# Patient Record
Sex: Male | Born: 1975 | Race: White | Hispanic: No | Marital: Married | Smoking: Never smoker
Health system: Southern US, Community
[De-identification: ages and names within clinical notes are randomized; demographics above are authoritative.]

## PROBLEM LIST (undated history)

## (undated) DIAGNOSIS — R519 Headache, unspecified: Secondary | ICD-10-CM

## (undated) DIAGNOSIS — M1990 Unspecified osteoarthritis, unspecified site: Secondary | ICD-10-CM

## (undated) DIAGNOSIS — R51 Headache: Secondary | ICD-10-CM

## (undated) DIAGNOSIS — N200 Calculus of kidney: Secondary | ICD-10-CM

## (undated) DIAGNOSIS — G43909 Migraine, unspecified, not intractable, without status migrainosus: Secondary | ICD-10-CM

## (undated) HISTORY — DX: Headache, unspecified: R51.9

## (undated) HISTORY — DX: Unspecified osteoarthritis, unspecified site: M19.90

## (undated) HISTORY — DX: Migraine, unspecified, not intractable, without status migrainosus: G43.909

## (undated) HISTORY — DX: Calculus of kidney: N20.0

## (undated) HISTORY — PX: OTHER SURGICAL HISTORY: SHX169

## (undated) HISTORY — DX: Headache: R51

---

## 2007-08-12 ENCOUNTER — Emergency Department (HOSPITAL_COMMUNITY): Admission: EM | Admit: 2007-08-12 | Discharge: 2007-08-12 | Payer: Self-pay | Admitting: Emergency Medicine

## 2007-08-12 ENCOUNTER — Emergency Department (HOSPITAL_COMMUNITY): Admission: EM | Admit: 2007-08-12 | Discharge: 2007-08-12 | Payer: Self-pay | Admitting: Family Medicine

## 2008-05-08 ENCOUNTER — Emergency Department (HOSPITAL_COMMUNITY): Admission: EM | Admit: 2008-05-08 | Discharge: 2008-05-09 | Payer: Self-pay | Admitting: Emergency Medicine

## 2008-06-14 ENCOUNTER — Emergency Department (HOSPITAL_COMMUNITY): Admission: EM | Admit: 2008-06-14 | Discharge: 2008-06-14 | Payer: Self-pay | Admitting: Emergency Medicine

## 2010-05-27 IMAGING — CR DG CHEST 2V
2 series · 2 of 2 positions shown · non-contrast
Comparison: None.

CLINICAL DATA: Right chest pain.

CHEST - 2 VIEW

[w chest pa]
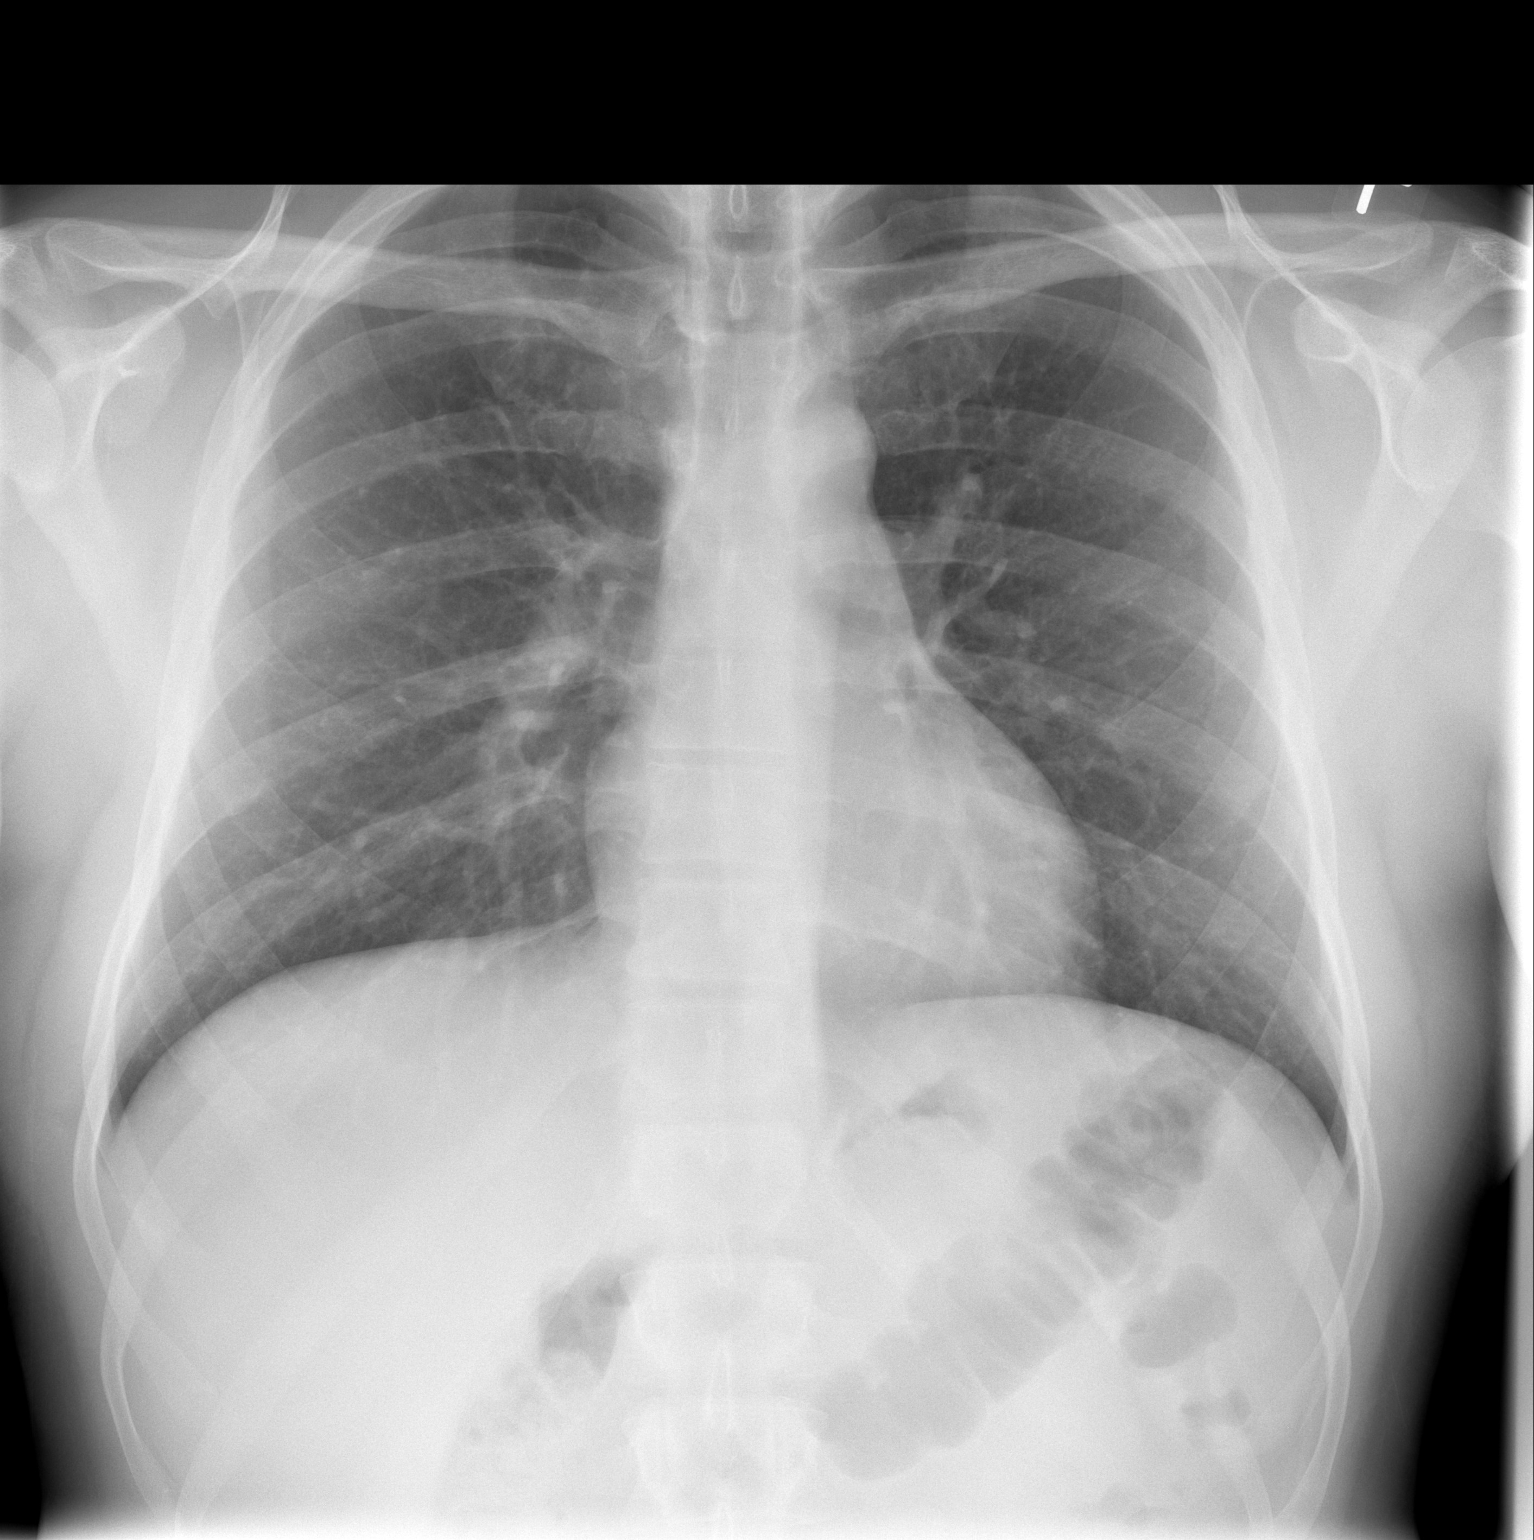

[w chest lat]
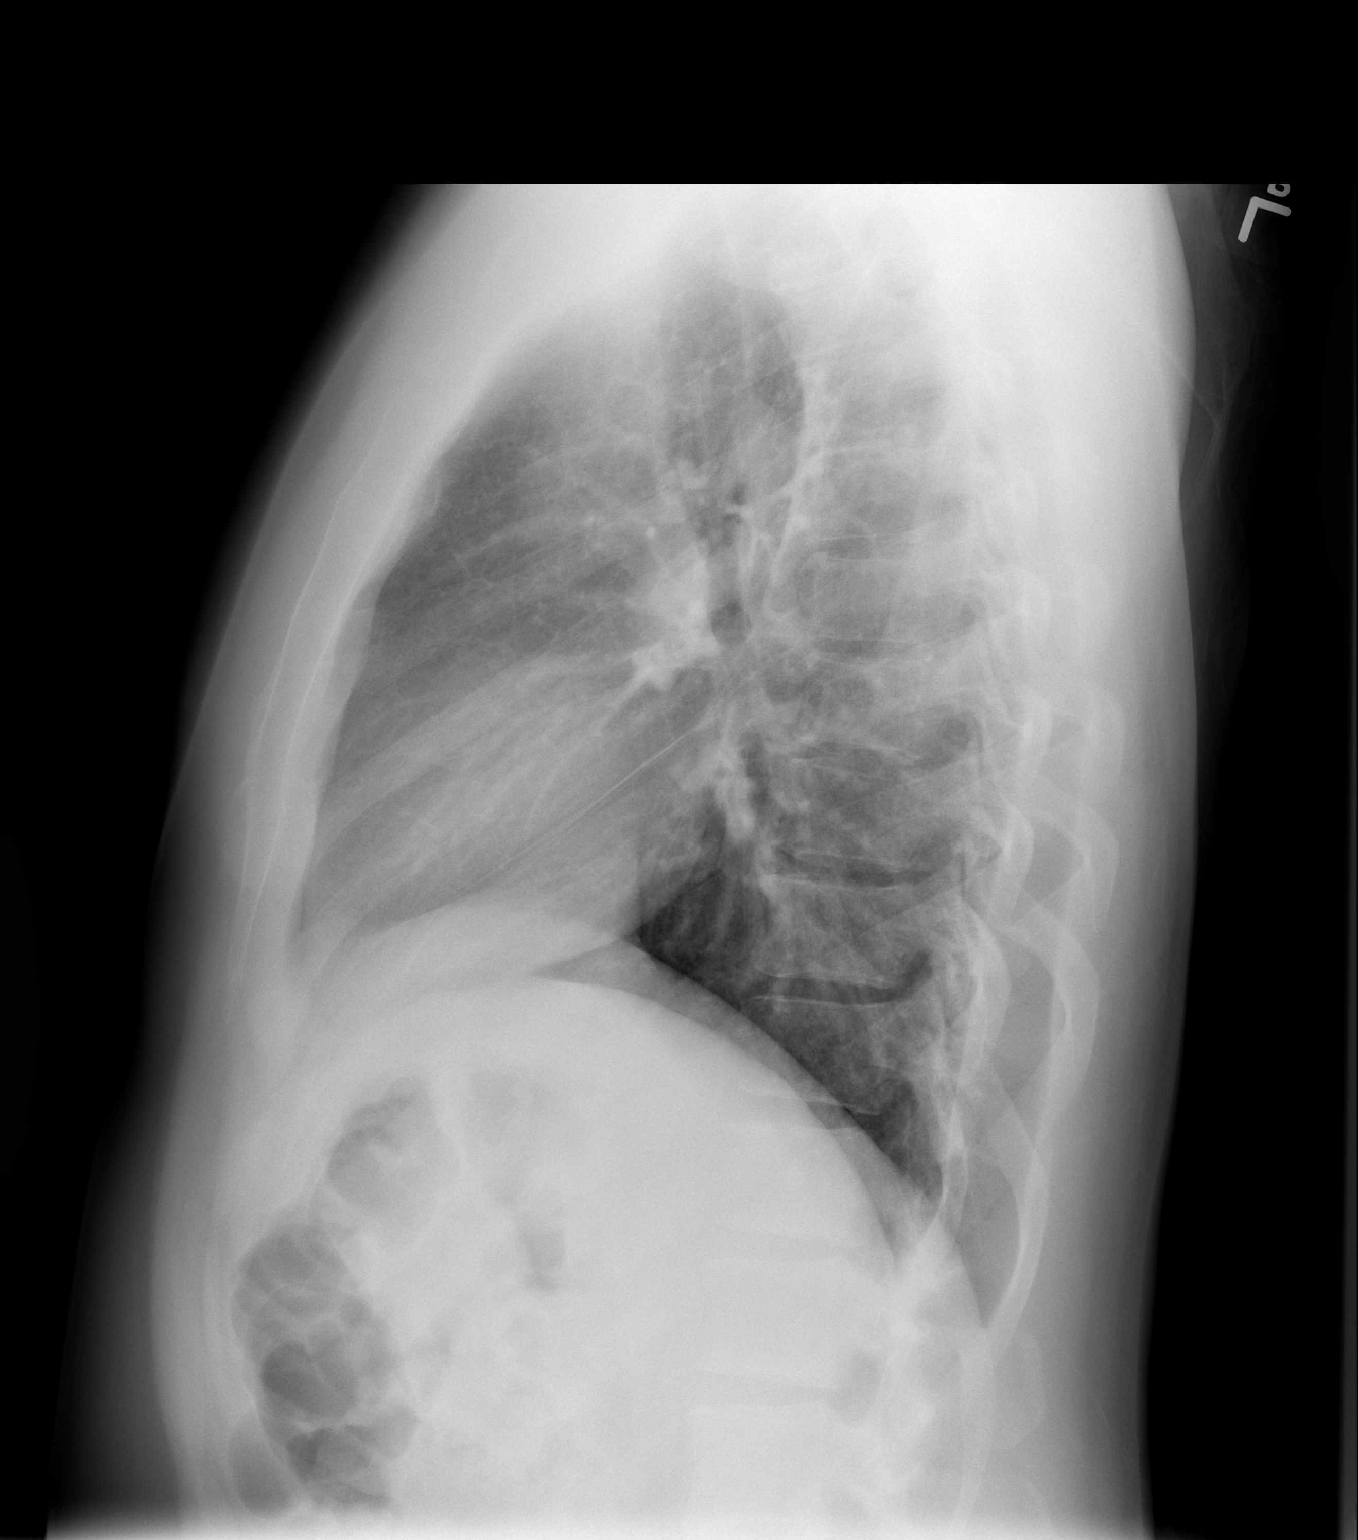

[2 of 2 positions shown; findings below may reference images not displayed]

FINDINGS: Normal sized heart.  Clear lungs with normal vascularity.
Minimal scoliosis.
IMPRESSION: No acute abnormality.

## 2010-06-21 LAB — DIFFERENTIAL
Basophils Absolute: 0 10*3/uL (ref 0.0–0.1)
Eosinophils Absolute: 0.2 10*3/uL (ref 0.0–0.7)
Eosinophils Relative: 2 % (ref 0–5)
Monocytes Absolute: 0.9 10*3/uL (ref 0.1–1.0)

## 2010-06-21 LAB — URINALYSIS, ROUTINE W REFLEX MICROSCOPIC
Hgb urine dipstick: NEGATIVE
Protein, ur: NEGATIVE mg/dL
Specific Gravity, Urine: 1.029 (ref 1.005–1.030)
Urobilinogen, UA: 1 mg/dL (ref 0.0–1.0)

## 2010-06-21 LAB — COMPREHENSIVE METABOLIC PANEL
ALT: 28 U/L (ref 0–53)
AST: 17 U/L (ref 0–37)
Albumin: 4.2 g/dL (ref 3.5–5.2)
Alkaline Phosphatase: 70 U/L (ref 39–117)
CO2: 29 mEq/L (ref 19–32)
Chloride: 101 mEq/L (ref 96–112)
GFR calc Af Amer: >60 mL/min (ref >60)
GFR calc non Af Amer: >60 mL/min (ref >60)
Potassium: 4 mEq/L (ref 3.5–5.1)
Sodium: 137 mEq/L (ref 135–145)
Total Bilirubin: 0.4 mg/dL (ref 0.3–1.2)

## 2010-06-21 LAB — CBC
MCV: 87.1 fL (ref 78.0–100.0)
Platelets: 243 10*3/uL (ref 150–400)
RBC: 5.08 MIL/uL (ref 4.22–5.81)
WBC: 10.9 10*3/uL — ABNORMAL HIGH (ref 4.0–10.5)

## 2015-12-14 ENCOUNTER — Emergency Department (HOSPITAL_COMMUNITY)
Admission: EM | Admit: 2015-12-14 | Discharge: 2015-12-14 | Disposition: A | Discharge status: Home / Self Care | Payer: Self-pay

## 2015-12-14 ENCOUNTER — Other Ambulatory Visit: Payer: Self-pay | Admitting: Nurse Practitioner

## 2015-12-14 ENCOUNTER — Ambulatory Visit
Admission: RE | Admit: 2015-12-14 | Discharge: 2015-12-14 | Disposition: A | Discharge status: Home / Self Care | Payer: Worker's Compensation | Source: Ambulatory Visit | Attending: Nurse Practitioner | Admitting: Nurse Practitioner

## 2015-12-14 DIAGNOSIS — T1490XA Injury, unspecified, initial encounter: Secondary | ICD-10-CM

## 2015-12-14 NOTE — ED Notes (Signed)
Per this pt administrator pt needs to go to a different hospital to be seen for workmans comp

## 2016-06-19 ENCOUNTER — Ambulatory Visit (INDEPENDENT_AMBULATORY_CARE_PROVIDER_SITE_OTHER): Payer: 59 | Admitting: Adult Health

## 2016-06-19 ENCOUNTER — Encounter: Payer: Self-pay | Admitting: Adult Health

## 2016-06-19 DIAGNOSIS — G43001 Migraine without aura, not intractable, with status migrainosus: Secondary | ICD-10-CM | POA: Diagnosis not present

## 2016-06-19 DIAGNOSIS — Z23 Encounter for immunization: Secondary | ICD-10-CM

## 2016-06-19 DIAGNOSIS — L918 Other hypertrophic disorders of the skin: Secondary | ICD-10-CM

## 2016-06-19 DIAGNOSIS — G5603 Carpal tunnel syndrome, bilateral upper limbs: Secondary | ICD-10-CM

## 2016-06-19 DIAGNOSIS — Z7689 Persons encountering health services in other specified circumstances: Secondary | ICD-10-CM

## 2016-06-19 MED ORDER — PREDNISONE 20 MG PO TABS
20.0000 mg | ORAL_TABLET | Freq: Every day | ORAL | 0 refills | Status: DC
Start: 2016-06-19 — End: 2020-05-31

## 2016-06-19 NOTE — Patient Instructions (Signed)
I have sent in a prescription for Prednisone to help with your carpal tunnel. Please take one pill per day for two weeks   Follow up with me for your physical or sooner if needed

## 2016-06-19 NOTE — Progress Notes (Signed)
Patient presents to clinic today to establish care. He is a pleasant 41 year old male who  has a past medical history of Arthritis; Frequent headaches; Kidney stones; and Migraines.   Acute Concerns: Establish Care  Carpal Tunnel Syndrome - this is been an ongoing issue for multiple months. He reports numbness and tingling in bilateral hands more notably in the first 3 digits of both hands. 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  Skin Tag Removal - he has 2 skin tags on his right lower buttocks that he reports are painful especially when sitting for multiple hours in a car.  Chronic Issues: Frequent headaches/migraines - once a week. This has been an ongoing issue. He takes Tylenol/ Goodies and reports that he gets relief from this medication.   Health Maintenance: Dental -- Routine Care Vision -- Does not do  Routine care Immunizations -- Needs Tdap  Colonoscopy -- Never had  Diet : Tries to eat healthy  Exercise: Does not exercise outside     Past Medical History:  Diagnosis Date  . Arthritis   . Frequent headaches   . Kidney stones   . Migraines     No past surgical history on file.  No current outpatient prescriptions on file prior to visit.   No current facility-administered medications on file prior to visit.     Not on File  No family history on file.  Social History   Social History  . Marital status: Married    Spouse name: N/A  . Number of children: N/A  . Years of education: N/A   Occupational History  . Not on file.   Social History Main Topics  . Smoking status: Not on file  . Smokeless tobacco: Not on file  . Alcohol use Not on file  . Drug use: Unknown  . Sexual activity: Not on file   Other Topics Concern  . Not on file   Social History Narrative  . No narrative on file    Review of Systems  Constitutional: Negative.   HENT: Negative.   Eyes: Negative.   Respiratory: Negative.   Cardiovascular:  Negative.   Gastrointestinal: Negative.   Genitourinary: Negative.   Musculoskeletal: Negative.   Skin: Negative.   Neurological: Positive for tingling, sensory change and headaches. Negative for focal weakness.  Endo/Heme/Allergies: Negative.   Psychiatric/Behavioral: Negative.   All other systems reviewed and are negative.   There were no vitals taken for this visit.  Physical Exam  Constitutional: He is oriented to person, place, and time and well-developed, well-nourished, and in no distress. No distress.  HENT:  Head: Normocephalic and atraumatic.  Right Ear: External ear normal.  Left Ear: External ear normal.  Nose: Nose normal.  Mouth/Throat: Oropharynx is clear and moist. No oropharyngeal exudate.  Eyes: Conjunctivae and EOM are normal. Pupils are equal, round, and reactive to light. Right eye exhibits no discharge. Left eye exhibits no discharge. No scleral icterus.  Neck: Trachea normal and normal range of motion. Neck supple. Carotid bruit is not present. No thyroid mass and no thyromegaly present.  Cardiovascular: Normal rate, regular rhythm, normal heart sounds and intact distal pulses.  Exam reveals no gallop and no friction rub.   No murmur heard. Pulmonary/Chest: Effort normal and breath sounds normal. No respiratory distress. He has no wheezes. He has no rales. He exhibits no tenderness.  Musculoskeletal: Normal range of motion. He exhibits no edema, tenderness or deformity.  Lymphadenopathy:  He has no cervical adenopathy.  Neurological: He is alert and oriented to person, place, and time. Gait normal. GCS score is 15.  Skin: Skin is warm and dry. No rash noted. He is not diaphoretic. No erythema. No pallor.  2 noninflamed skin tags located on right lower buttocks  Psychiatric: Memory, affect and judgment normal.  Nursing note and vitals reviewed.   Assessment/Plan: 1. Encounter to establish care - Advised to follow-up for complete physical exam -  Encouraged a heart healthy diet and frequent exercise  2. Bilateral carpal tunnel syndrome  - predniSONE (DELTASONE) 20 MG tablet; Take 1 tablet (20 mg total) by mouth daily with breakfast.  Dispense: 14 tablet; Refill: 0 - Advised to use wrist splints during the day when he is sleeping - Consider referral to sports medicine for cortisone injection 3. Need for Tdap vaccination  - Tdap vaccine greater than or equal to 7yo IM  4. Skin tag  Skin tags are snipped off using Betadine for cleansing and sterile iris scissors. Local anesthesia was not used. These pathognomonic lesions are not sent for pathology.   5. Migraine without aura and with status migrainosus, not intractable - We spoke about migraine triggers. He is going to work on staying better hydrated. He does not want try prophylactic medication at this time. - Consider Elavil - Sitter MRI in the future if migraines do not dissipate   Dorothyann Peng, NP

## 2018-04-11 ENCOUNTER — Ambulatory Visit: Payer: Self-pay | Admitting: Nurse Practitioner

## 2018-04-11 VITALS — BP 114/70 | HR 102 | Temp 103.0°F | Wt 217.8 lb

## 2018-04-11 DIAGNOSIS — J101 Influenza due to other identified influenza virus with other respiratory manifestations: Secondary | ICD-10-CM

## 2018-04-11 DIAGNOSIS — R509 Fever, unspecified: Secondary | ICD-10-CM

## 2018-04-11 LAB — POCT INFLUENZA A/B
Influenza A, POC: POSITIVE — AB
Influenza B, POC: NEGATIVE

## 2018-04-11 MED ORDER — OSELTAMIVIR PHOSPHATE 75 MG PO CAPS: 75.0000 mg | ORAL_CAPSULE | Freq: Two times a day (BID) | ORAL | 0 refills | Status: AC

## 2018-04-11 MED ORDER — PROMETHAZINE-DM 6.25-15 MG/5ML PO SYRP: 5.0000 mL | ORAL_SOLUTION | Freq: Four times a day (QID) | ORAL | 0 refills | Status: AC | PRN

## 2018-04-11 MED ORDER — PSEUDOEPH-BROMPHEN-DM 30-2-10 MG/5ML PO SYRP: 5.0000 mL | ORAL_SOLUTION | Freq: Four times a day (QID) | ORAL | 0 refills | Status: AC | PRN

## 2018-04-11 NOTE — Progress Notes (Signed)
Subjective:     Cody Bright is a 43 y.o. male who presents for evaluation of influenza like symptoms. Symptoms include fevers up to 103 degrees, chills, dry cough, headache, myalgias, sore throat and fatigue and nausea and have been present for 2 days.  Patient denies sinus pain, sinus pressure, earache, ear drainage, productive cough, abdominal pain, vomiting or diarrhea.  Patient admits to decreased fluid intake.  Patient rates his current headache pain 7/10 at present and describes it as "constant".  Patient also states that his throat pain worsens with coughing.  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 He has tried to alleviate the symptoms with Dayquil with no relief. High risk factors for influenza complications: none.  Patient also informs he has not received an influenza vaccine this season.  The following portions of the patient's history were reviewed and updated as appropriate: allergies, current medications and past medical history.  Past Medical History:  Diagnosis Date  . Arthritis   . Frequent headaches   . Kidney stones   . Kidney stones   . Migraines    Review of Systems Constitutional: positive for anorexia, chills, fatigue, fevers and malaise, negative for weight loss Eyes: negative Ears, nose, mouth, throat, and face: positive for nasal congestion and sore throat, negative for ear drainage and earaches Respiratory: positive for cough, negative for asthma, chronic bronchitis, dyspnea on exertion, stridor and wheezing Cardiovascular: negative Gastrointestinal: positive for nausea, decreased appetite, negative for abdominal pain, diarrhea and vomiting Neurological: positive for headaches, negative for coordination problems, dizziness, paresthesia, vertigo and weakness     Objective:    BP 114/70 (BP Location: Right Arm, Patient Position: Sitting, Cuff Size: Normal)   Pulse (!) 104   Temp (!) 103 F (39.4 C) (Oral)   Wt 217 lb 12.8 oz (98.8  kg)   SpO2 98%    Physical Exam Vitals signs reviewed.  Constitutional:      General: He is not in acute distress.    Comments: Appears uncomfortable  HENT:     Head: Normocephalic.     Right Ear: Tympanic membrane, ear canal and external ear normal.     Left Ear: Tympanic membrane, ear canal and external ear normal.     Nose: Congestion present.     Mouth/Throat:     Mouth: Mucous membranes are moist.     Pharynx: Posterior oropharyngeal erythema present. No oropharyngeal exudate.  Eyes:     Pupils: Pupils are equal, round, and reactive to light.  Neck:     Musculoskeletal: Normal range of motion and neck supple. No neck rigidity.  Cardiovascular:     Rate and Rhythm: Regular rhythm. Tachycardia present.     Pulses: Normal pulses.     Heart sounds: Normal heart sounds.  Pulmonary:     Effort: Pulmonary effort is normal.     Breath sounds: Normal breath sounds.  Abdominal:     General: Abdomen is flat. There is no distension.     Tenderness: There is no abdominal tenderness.  Lymphadenopathy:     Cervical: Cervical adenopathy (bilateral superficial) present.  Skin:    General: Skin is warm and dry.     Capillary Refill: Capillary refill takes less than 2 seconds.  Neurological:     General: No focal deficit present.     Mental Status: He is alert and oriented to person, place, and time.     Cranial Nerves: No cranial nerve deficit.  Psychiatric:  Mood and Affect: Mood normal.        Thought Content: Thought content normal.    Patient was provided oral hydration in the office to help decrease his HR.  HR @ time of d/c: 102   Results for orders placed or performed in visit on 04/11/18 (from the past 24 hour(s))  POCT Influenza A/B     Status: Abnormal   Collection Time: 04/11/18  5:19 PM  Result Value Ref Range   Influenza A, POC Positive (A) Negative   Influenza B, POC Negative Negative    Assessment:  Influenza A   Plan:   Exam findings, diagnosis  etiology and medication use and indications reviewed with patient. Follow- Up and discharge instructions provided. No emergent/urgent issues found on exam.  Some the patient's clinical presentation, symptoms, physical exam, and positive influenza A test, patient's findings are consistent with that of influenza A.  Discussed the use of Tamiflu with the patient, which he agreed.  We will also provide symptomatic treatment for the patient's cough.  Patient has not been taking any antipyretics.  Advised patient that he will need to begin ibuprofen or Tylenol as soon as he gets home.  Patient was also provided symptomatic treatment for his cough with Bromfed for daytime and Promethazine DM for nighttime.  Reviewed with patient that he should also remain home until fever free for at least 24 hours.  Patient education was provided. Patient verbalized understanding of information provided and agrees with plan of care (POC), all questions answered. The patient is advised to call or return to clinic if condition does not see an improvement in symptoms, or to seek the care of the closest emergency department if condition worsens with the above plan.   1. Fever, unspecified fever cause  - POCT Influenza A/B  2. Influenza A  - oseltamivir (TAMIFLU) 75 MG capsule; Take 1 capsule (75 mg total) by mouth 2 (two) times daily for 5 days.  Dispense: 10 capsule; Refill: 0 - brompheniramine-pseudoephedrine-DM 30-2-10 MG/5ML syrup; Take 5 mLs by mouth 4 (four) times daily as needed for up to 7 days.  Dispense: 150 mL; Refill: 0 - promethazine-dextromethorphan (PROMETHAZINE-DM) 6.25-15 MG/5ML syrup; Take 5 mLs by mouth 4 (four) times daily as needed for up to 7 days.  Dispense: 140 mL; Refill: 0 -Take medication as prescribed. -Ibuprofen or Tylenol for pain, fever, or general discomfort. -Increase fluids. -Get plenty of fluids. -Sleep elevated on at least 2 pillows at bedtime to help with cough. -Use a humidifier or  vaporizer when at home and during sleep. -May use a teaspoon of honey or over-the-counter cough drops to help with cough. -Remain home until fever-free for 24 hours. -May use normal saline nasal spray to help with nasal congestion throughout the day if needed. -Return to our office for an influenza vaccine. -Follow-up if symptoms do not improve.

## 2018-04-11 NOTE — Patient Instructions (Signed)
Influenza, Adult -Take medication as prescribed. -Ibuprofen or Tylenol for pain, fever, or general discomfort. -Increase fluids. -Get plenty of fluids. -Sleep elevated on at least 2 pillows at bedtime to help with cough. -Use a humidifier or vaporizer when at home and during sleep. -May use a teaspoon of honey or over-the-counter cough drops to help with cough. -Remain home until fever-free for 24 hours. -May use normal saline nasal spray to help with nasal congestion throughout the day if needed. -Return to our office for an influenza vaccine. -Follow-up if symptoms do not improve.  Influenza, more commonly known as "the flu," is a viral infection that mainly affects the respiratory tract. The respiratory tract includes organs that help you breathe, such as the lungs, nose, and throat. The flu causes many symptoms similar to the common cold along with high fever and body aches. The flu spreads easily from person to person (is contagious). Getting a flu shot (influenza vaccination) every year is the best way to prevent the flu. What are the causes? This condition is caused by the influenza virus. You can get the virus by:  Breathing in droplets that are in the air from an infected person's cough or sneeze.  Touching something that has been exposed to the virus (has been contaminated) and then touching your mouth, nose, or eyes. What increases the risk? The following factors may make you more likely to get the flu:  Not washing or sanitizing your hands often.  Having close contact with many people during cold and flu season.  Touching your mouth, eyes, or nose without first washing or sanitizing your hands.  Not getting a yearly (annual) flu shot. You may have a higher risk for the flu, including serious problems such as a lung infection (pneumonia), if you:  Are older than 65.  Are pregnant.  Have a weakened disease-fighting system (immune system). You may have a weakened immune  system if you: ? Have HIV or AIDS. ? Are undergoing chemotherapy. ? Are taking medicines that reduce (suppress) the activity of your immune system.  Have a long-term (chronic) illness, such as heart disease, kidney disease, diabetes, or lung disease.  Have a liver disorder.  Are severely overweight (morbidly obese).  Have anemia. This is a condition that affects your red blood cells.  Have asthma. What are the signs or symptoms? Symptoms of this condition usually begin suddenly and last 4-14 days. They may include:  Fever and chills.  Headaches, body aches, or muscle aches.  Sore throat.  Cough.  Runny or stuffy (congested) nose.  Chest discomfort.  Poor appetite.  Weakness or fatigue.  Dizziness.  Nausea or vomiting. How is this diagnosed? This condition may be diagnosed based on:  Your symptoms and medical history.  A physical exam.  Swabbing your nose or throat and testing the fluid for the influenza virus. How is this treated? If the flu is diagnosed early, you can be treated with medicine that can help reduce how severe the illness is and how long it lasts (antiviral medicine). This may be given by mouth (orally) or through an IV. Taking care of yourself at home can help relieve symptoms. Your health care provider may recommend:  Taking over-the-counter medicines.  Drinking plenty of fluids. In many cases, the flu goes away on its own. If you have severe symptoms or complications, you may be treated in a hospital. Follow these instructions at home: Activity  Rest as needed and get plenty of sleep.  Stay home  from work or school as told by your health care provider. Unless you are visiting your health care provider, avoid leaving home until your fever has been gone for 24 hours without taking medicine. Eating and drinking  Take an oral rehydration solution (ORS). This is a drink that is sold at pharmacies and retail stores.  Drink enough fluid to keep  your urine pale yellow.  Drink clear fluids in small amounts as you are able. Clear fluids include water, ice chips, diluted fruit juice, and low-calorie sports drinks.  Eat bland, easy-to-digest foods in small amounts as you are able. These foods include bananas, applesauce, rice, lean meats, toast, and crackers.  Avoid drinking fluids that contain a lot of sugar or caffeine, such as energy drinks, regular sports drinks, and soda.  Avoid alcohol.  Avoid spicy or fatty foods. General instructions      Take over-the-counter and prescription medicines only as told by your health care provider.  Use a cool mist humidifier to add humidity to the air in your home. This can make it easier to breathe.  Cover your mouth and nose when you cough or sneeze.  Wash your hands with soap and water often, especially after you cough or sneeze. If soap and water are not available, use alcohol-based hand sanitizer.  Keep all follow-up visits as told by your health care provider. This is important. How is this prevented?   Get an annual flu shot. You may get the flu shot in late summer, fall, or winter. Ask your health care provider when you should get your flu shot.  Avoid contact with people who are sick during cold and flu season. This is generally fall and winter. Contact a health care provider if:  You develop new symptoms.  You have: ? Chest pain. ? Diarrhea. ? A fever.  Your cough gets worse.  You produce more mucus.  You feel nauseous or you vomit. Get help right away if:  You develop shortness of breath or difficulty breathing.  Your skin or nails turn a bluish color.  You have severe pain or stiffness in your neck.  You develop a sudden headache or sudden pain in your face or ear.  You cannot eat or drink without vomiting. Summary  Influenza, more commonly known as "the flu," is a viral infection that primarily affects your respiratory tract.  Symptoms of the flu  usually begin suddenly and last 4-14 days.  Getting an annual flu shot is the best way to prevent getting the flu.  Stay home from work or school as told by your health care provider. Unless you are visiting your health care provider, avoid leaving home until your fever has been gone for 24 hours without taking medicine.  Keep all follow-up visits as told by your health care provider. This is important. This information is not intended to replace advice given to you by your health care provider. Make sure you discuss any questions you have with your health care provider. Document Released: 02/24/2000 Document Revised: 08/14/2017 Document Reviewed: 08/14/2017 Elsevier Interactive Patient Education  2019 [employer].

## 2018-04-15 ENCOUNTER — Telehealth: Payer: Self-pay | Admitting: Emergency Medicine

## 2018-04-15 NOTE — Telephone Encounter (Signed)
Left message follow up call from visit with [facility] in [place]

## 2018-06-20 ENCOUNTER — Ambulatory Visit: Payer: Self-pay

## 2018-06-20 NOTE — Telephone Encounter (Signed)
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 He has the flu in January and states that he has never completely gotten rid of the congestion. He calls today with a sore throat he describes as something suck to the back of his throat. He has a cough He has no fever. He denies chest pin and SOB. He states he had a sinus headache Monday but not today. Per protocol Home care instructions read to patient. Pt verbalized understanding of all instructions.   Reason for Disposition . 1] COVID-19 infection diagnosed or suspected AND [2] mild symptoms (fever, cough) AND [2] no trouble breathing or other complications  Answer Assessment - Initial Assessment Questions 1. COVID-19 DIAGNOSIS: "Who made your Coronavirus (COVID-19) diagnosis?" "Was it confirmed by a positive lab test?" If not diagnosed by a HCP, ask "Are there lots of cases (community spread) where you live?" (See public health department website, if unsure)   * MAJOR community spread: high number of cases; numbers of cases are increasing; many people hospitalized.   * MINOR community spread: low number of cases; not increasing; few or no people hospitalized     [place] 2. ONSET: "When did the COVID-19 symptoms start?"      Since January had throat scratch symptoms 3. WORST SYMPTOM: "What is your worst symptom?" (e.g., cough, fever, shortness of breath, muscle aches)     Cough wet, no other symptoms 4. COUGH: "How bad is the cough?"       worse that it has been 5. FEVER: "Do you have a fever?" If so, ask: "What is your temperature, how was it measured, and when did it start?"     No 6. RESPIRATORY STATUS: "Describe your breathing?" (e.g., shortness of breath, wheezing, unable to speak)      no 7. BETTER-SAME-WORSE: "Are you getting better, staying the same or getting worse compared to yesterday?"  If getting worse, ask, "In what way?"     About the same feels like something in the back of his throat 8.  HIGH RISK DISEASE: "Do you have any chronic medical problems?" (e.g., asthma, heart or lung disease, weak immune system, etc.)     no 9. PREGNANCY: "Is there any chance you are pregnant?" "When was your last menstrual period?"     N/A 10. OTHER SYMPTOMS: "Do you have any other symptoms?"  (e.g., runny nose, headache, sore throat, loss of smell)       Back of throat feels like something is there, Sinus HA this week  Protocols used: CORONAVIRUS (COVID-19) DIAGNOSED OR SUSPECTED-A-AH

## 2018-06-23 NOTE — Telephone Encounter (Signed)
FYI

## 2020-05-31 ENCOUNTER — Encounter: Payer: Self-pay | Admitting: Internal Medicine

## 2020-05-31 ENCOUNTER — Other Ambulatory Visit: Payer: Self-pay

## 2020-05-31 ENCOUNTER — Ambulatory Visit: Payer: 59 | Admitting: Internal Medicine

## 2020-05-31 VITALS — BP 118/80 | HR 72 | Temp 98.4°F | Resp 16 | Ht 70.0 in | Wt 223.0 lb

## 2020-05-31 DIAGNOSIS — Z302 Encounter for sterilization: Secondary | ICD-10-CM | POA: Insufficient documentation

## 2020-05-31 DIAGNOSIS — Z1211 Encounter for screening for malignant neoplasm of colon: Secondary | ICD-10-CM | POA: Insufficient documentation

## 2020-05-31 DIAGNOSIS — Z0001 Encounter for general adult medical examination with abnormal findings: Secondary | ICD-10-CM | POA: Insufficient documentation

## 2020-05-31 DIAGNOSIS — G43719 Chronic migraine without aura, intractable, without status migrainosus: Secondary | ICD-10-CM

## 2020-05-31 DIAGNOSIS — N3281 Overactive bladder: Secondary | ICD-10-CM | POA: Insufficient documentation

## 2020-05-31 DIAGNOSIS — Z23 Encounter for immunization: Secondary | ICD-10-CM

## 2020-05-31 DIAGNOSIS — Z0000 Encounter for general adult medical examination without abnormal findings: Secondary | ICD-10-CM

## 2020-05-31 DIAGNOSIS — R350 Frequency of micturition: Secondary | ICD-10-CM | POA: Diagnosis not present

## 2020-05-31 LAB — URINALYSIS, ROUTINE W REFLEX MICROSCOPIC
Bilirubin Urine: NEGATIVE
Ketones, ur: NEGATIVE
Leukocytes,Ua: NEGATIVE
Nitrite: NEGATIVE
Specific Gravity, Urine: 1.025 (ref 1.000–1.030)
Total Protein, Urine: NEGATIVE
Urine Glucose: NEGATIVE
Urobilinogen, UA: 1 (ref 0.0–1.0)
pH: 6 (ref 5.0–8.0)

## 2020-05-31 LAB — CBC WITH DIFFERENTIAL/PLATELET
Basophils Absolute: 0 10*3/uL (ref 0.0–0.1)
Basophils Relative: 0.3 % (ref 0.0–3.0)
Eosinophils Absolute: 0.2 10*3/uL (ref 0.0–0.7)
Eosinophils Relative: 2.2 % (ref 0.0–5.0)
HCT: 46.9 % (ref 39.0–52.0)
Hemoglobin: 16.3 g/dL (ref 13.0–17.0)
Lymphocytes Relative: 33.4 % (ref 12.0–46.0)
Lymphs Abs: 2.8 10*3/uL (ref 0.7–4.0)
MCHC: 34.8 g/dL (ref 30.0–36.0)
MCV: 85.8 fl (ref 78.0–100.0)
Monocytes Absolute: 0.7 10*3/uL (ref 0.1–1.0)
Monocytes Relative: 8.2 % (ref 3.0–12.0)
Neutro Abs: 4.7 10*3/uL (ref 1.4–7.7)
Neutrophils Relative %: 55.9 % (ref 43.0–77.0)
Platelets: 281 10*3/uL (ref 150.0–400.0)
RBC: 5.46 Mil/uL (ref 4.22–5.81)
RDW: 12.8 % (ref 11.5–15.5)
WBC: 8.3 10*3/uL (ref 4.0–10.5)

## 2020-05-31 LAB — LIPID PANEL
Cholesterol: 168 mg/dL (ref 0–200)
HDL: 33.7 mg/dL — ABNORMAL LOW (ref >39.00)
LDL Cholesterol: 108 mg/dL — ABNORMAL HIGH (ref 0–99)
NonHDL: 134.79
Total CHOL/HDL Ratio: 5
Triglycerides: 136 mg/dL (ref 0.0–149.0)
VLDL: 27.2 mg/dL (ref 0.0–40.0)

## 2020-05-31 LAB — HEPATIC FUNCTION PANEL
ALT: 25 U/L (ref 0–53)
AST: 11 U/L (ref 0–37)
Albumin: 4.5 g/dL (ref 3.5–5.2)
Alkaline Phosphatase: 79 U/L (ref 39–117)
Bilirubin, Direct: 0.1 mg/dL (ref 0.0–0.3)
Total Bilirubin: 0.8 mg/dL (ref 0.2–1.2)
Total Protein: 7 g/dL (ref 6.0–8.3)

## 2020-05-31 LAB — BASIC METABOLIC PANEL
BUN: 17 mg/dL (ref 6–23)
CO2: 27 mEq/L (ref 19–32)
Calcium: 9.3 mg/dL (ref 8.4–10.5)
Chloride: 103 mEq/L (ref 96–112)
Creatinine, Ser: 1.31 mg/dL (ref 0.40–1.50)
GFR: 65.15 mL/min (ref >60.00)
Glucose, Bld: 91 mg/dL (ref 70–99)
Potassium: 4.3 mEq/L (ref 3.5–5.1)
Sodium: 139 mEq/L (ref 135–145)

## 2020-05-31 LAB — TSH: TSH: 1.97 u[IU]/mL (ref 0.35–4.50)

## 2020-05-31 LAB — PSA: PSA: 0.83 ng/mL (ref 0.10–4.00)

## 2020-05-31 MED ORDER — SOLIFENACIN SUCCINATE 5 MG PO TABS: 5.0000 mg | ORAL_TABLET | Freq: Every day | ORAL | 0 refills | Status: DC

## 2020-05-31 MED ORDER — QULIPTA 60 MG PO TABS: 1.0000 | ORAL_TABLET | Freq: Every day | ORAL | 1 refills | Status: DC

## 2020-05-31 NOTE — Progress Notes (Addendum)
Subjective:  Patient ID: Cody Bright, male    DOB: Feb 16, 1976  Age: 45 y.o. MRN: [id number]  CC: Annual Exam  This visit occurred during the SARS-CoV-2 public health emergency.  Safety protocols were in place, including screening questions prior to the visit, additional usage of staff PPE, and extensive cleaning of exam room while observing appropriate contact time as indicated for disinfecting solutions.    HPI Cody Bright presents for a CPX and to establish.  He complains of a 20-year history of frequent urination.  He gets up about twice a night to urinate.  His urine flow is excellent and he does not experience dysuria or hematuria.  The symptoms are exacerbated by caffeine intake.  He wants to undergo a vasectomy.  He complains of worsening migraines over the last year - a pounding sensation over his parietal region.  He has had migraine headaches since he was a teenager.  They recently become more frequent.  He is having a couple headaches a week.  There is some photo and phono sensitivity.  He denies nausea, vomiting, changes in his vision or hearing, or paresthesias.    He wants to get a shingles vaccine.  Outpatient Medications Prior to Visit  Medication Sig Dispense Refill  . predniSONE (DELTASONE) 20 MG tablet Take 1 tablet (20 mg total) by mouth daily with breakfast. (Patient not taking: Reported on 04/11/2018) 14 tablet 0   No facility-administered medications prior to visit.    Past Medical History:  Diagnosis Date  . Arthritis   . Frequent headaches   . Kidney stones   . Kidney stones   . Migraines    Past Surgical History:  Procedure Laterality Date  . Eye surgery      Lasic     reports that he has never smoked. He uses smokeless tobacco. He reports current alcohol use of about 3.0 standard drinks of alcohol per week. He reports that he does not use drugs. family history includes Heart disease in his maternal grandmother; High blood pressure  in his father; Migraines in his mother. No Known Allergies  ROS Review of Systems  Constitutional: Negative.  Negative for diaphoresis and fatigue.  HENT: Negative.  Negative for trouble swallowing.   Eyes: Positive for photophobia. Negative for pain and visual disturbance.  Respiratory: Negative.  Negative for cough, chest tightness and wheezing.   Cardiovascular: Negative for chest pain, palpitations and leg swelling.  Gastrointestinal: Negative for abdominal pain, constipation, diarrhea, nausea and vomiting.  Endocrine: Positive for polyuria.  Genitourinary: Positive for frequency. Negative for difficulty urinating, dysuria, flank pain, hematuria, scrotal swelling, testicular pain and urgency.  Musculoskeletal: Negative.  Negative for arthralgias and neck pain.  Skin: Negative.   Neurological: Positive for headaches. Negative for dizziness, syncope, speech difficulty, weakness, light-headedness and numbness.  Hematological: Negative for adenopathy. Does not bruise/bleed easily.  Psychiatric/Behavioral: Negative.     Objective:  BP 118/80   Pulse 72   Temp 98.4 F (36.9 C) (Oral)   Resp 16   Ht 5' 10"  (1.778 m)   Wt 223 lb (101.2 kg)   SpO2 98%   BMI 32.00 kg/m   BP Readings from Last 3 Encounters:  05/31/20 118/80  04/11/18 114/70  12/14/15 134/95    Wt Readings from Last 3 Encounters:  05/31/20 223 lb (101.2 kg)  04/11/18 217 lb 12.8 oz (98.8 kg)    Physical Exam Vitals reviewed.  HENT:     Nose: Nose normal.  Eyes:  General: No scleral icterus.    Extraocular Movements: Extraocular movements intact.     Pupils: Pupils are equal, round, and reactive to light.  Cardiovascular:     Rate and Rhythm: Normal rate and regular rhythm.     Heart sounds: No murmur heard.   Pulmonary:     Effort: Pulmonary effort is normal.     Breath sounds: No stridor. No wheezing, rhonchi or rales.  Abdominal:     General: Abdomen is flat.     Palpations: There is no mass.      Tenderness: There is no abdominal tenderness. There is no guarding.     Hernia: There is no hernia in the left inguinal area or right inguinal area.  Genitourinary:    Pubic Area: No rash.      Penis: Normal and uncircumcised.      Testes: Normal.     Epididymis:     Right: Normal.     Left: Normal.     Prostate: Normal. Not enlarged, not tender and no nodules present.     Rectum: Normal. Guaiac result negative. No mass, tenderness, anal fissure, external hemorrhoid or internal hemorrhoid. Normal anal tone.  Musculoskeletal:        General: Normal range of motion.     Cervical back: Neck supple.     Right lower leg: No edema.     Left lower leg: No edema.  Lymphadenopathy:     Cervical: No cervical adenopathy.     Lower Body: No right inguinal adenopathy. No left inguinal adenopathy.  Skin:    General: Skin is warm and dry.     Coloration: Skin is not pale.  Neurological:     General: No focal deficit present.     Mental Status: He is alert and oriented to person, place, and time. Mental status is at baseline.     Cranial Nerves: No cranial nerve deficit.     Sensory: No sensory deficit.     Motor: No weakness.     Coordination: Coordination normal.     Gait: Gait normal.     Deep Tendon Reflexes: Reflexes normal.  Psychiatric:        Mood and Affect: Mood normal.        Behavior: Behavior normal.     Lab Results  Component Value Date   WBC 8.3 05/31/2020   HGB 16.3 05/31/2020   HCT 46.9 05/31/2020   PLT 281.0 05/31/2020   GLUCOSE 91 05/31/2020   CHOL 168 05/31/2020   TRIG 136.0 05/31/2020   HDL 33.70 (L) 05/31/2020   LDLCALC 108 (H) 05/31/2020   ALT 25 05/31/2020   AST 11 05/31/2020   NA 139 05/31/2020   K 4.3 05/31/2020   CL 103 05/31/2020   CREATININE 1.31 05/31/2020   BUN 17 05/31/2020   CO2 27 05/31/2020   TSH 1.97 05/31/2020   PSA 0.83 05/31/2020    DG Finger Ring Right  Result Date: 12/14/2015 CLINICAL DATA:  Finger caught hand automatic  window of car EXAM: RIGHT FOURTH FINGER 2+V COMPARISON:  None. FINDINGS: Frontal, oblique, and lateral views obtained. There is an incomplete nondisplaced fracture of the distal aspect of the fourth distal phalanx. There is soft tissue swelling distally. No other fracture. No dislocation. No evident arthropathy. IMPRESSION: Incomplete nondisplaced fracture distal aspect fourth distal phalanx. Soft tissue swelling distally. No other fracture. No dislocation. No evident arthropathy. These results will be called to the ordering clinician or representative by the Radiologist Assistant,  and communication documented in the PACS or zVision Dashboard. Electronically Signed   By: Lowella Grip III M.D.   On: 12/14/2015 08:47    Assessment & Plan:   Cody Bright was seen today for annual exam.  Diagnoses and all orders for this visit:  Encounter for general adult medical examination with abnormal findings- Exam completed, labs reviewed, vaccines reviewed, cancer screenings addressed, patient education was given. -     Lipid panel; Future -     PSA; Future -     Hepatitis C antibody; Future -     HIV Antibody (routine testing w rflx); Future -     HIV Antibody (routine testing w rflx) -     Hepatitis C antibody -     PSA -     Lipid panel  Frequent urination- He has a paucity of other symptoms.  His labs are normal.  His UA is normal.  I will treat him for OAB. -     CBC with Differential/Platelet; Future -     Basic metabolic panel; Future -     TSH; Future -     Urinalysis, Routine w reflex microscopic; Future -     Hepatic function panel; Future -     Hepatic function panel -     Urinalysis, Routine w reflex microscopic -     TSH -     Basic metabolic panel -     CBC with Differential/Platelet  Screen for colon cancer -     Cologuard  Intractable chronic migraine without aura and without status migrainosus- I recommended that he treat this with CGRP antagonists. -     Discontinue: Atogepant  (QULIPTA) 60 MG TABS; Take 1 tablet by mouth daily. -     Galcanezumab-gnlm (EMGALITY) 120 MG/ML SOAJ; Inject 240 mg into the skin once for 1 dose. -     Galcanezumab-gnlm (EMGALITY) 120 MG/ML SOAJ; Inject 1 Act into the skin every 30 (thirty) days. -     Ubrogepant (UBRELVY) 100 MG TABS; Take 1 tablet by mouth daily as needed.  Encounter for sterilization in male -     Ambulatory referral to Urology  OAB (overactive bladder) -     solifenacin (VESICARE) 5 MG tablet; Take 1 tablet (5 mg total) by mouth daily.  Other orders -     Varicella-zoster vaccine IM (Shingrix)   I have discontinued Cody Bright. Cody "Jason"'s predniSONE and Qulipta. I am also having him start on solifenacin, Emgality, [place], and [place].  Meds ordered this encounter  Medications  . DISCONTD: Atogepant (QULIPTA) 60 MG TABS    Sig: Take 1 tablet by mouth daily.    Dispense:  90 tablet    Refill:  1  . solifenacin (VESICARE) 5 MG tablet    Sig: Take 1 tablet (5 mg total) by mouth daily.    Dispense:  90 tablet    Refill:  0  . Galcanezumab-gnlm (EMGALITY) 120 MG/ML SOAJ    Sig: Inject 240 mg into the skin once for 1 dose.    Dispense:  2 mL    Refill:  0  . Galcanezumab-gnlm (EMGALITY) 120 MG/ML SOAJ    Sig: Inject 1 Act into the skin every 30 (thirty) days.    Dispense:  3.36 mL    Refill:  1  . Ubrogepant (UBRELVY) 100 MG TABS    Sig: Take 1 tablet by mouth daily as needed.    Dispense:  30 tablet    Refill:  1     Follow-up: Return in about 4 months (around 09/30/2020).  Scarlette Calico, MD

## 2020-05-31 NOTE — Patient Instructions (Signed)

## 2020-06-01 ENCOUNTER — Encounter: Payer: Self-pay | Admitting: Internal Medicine

## 2020-06-01 LAB — HIV ANTIBODY (ROUTINE TESTING W REFLEX): HIV 1&2 Ab, 4th Generation: NONREACTIVE

## 2020-06-01 LAB — HEPATITIS C ANTIBODY
Hepatitis C Ab: NONREACTIVE
SIGNAL TO CUT-OFF: 0.01 (ref <1.00)

## 2020-06-02 MED ORDER — EMGALITY 120 MG/ML ~~LOC~~ SOAJ: 240.0000 mg | Freq: Once | SUBCUTANEOUS | 0 refills | Status: AC

## 2020-06-02 MED ORDER — EMGALITY 120 MG/ML ~~LOC~~ SOAJ: 1.0000 | SUBCUTANEOUS | 1 refills | Status: DC

## 2020-06-02 MED ORDER — UBRELVY 100 MG PO TABS: 1.0000 | ORAL_TABLET | Freq: Every day | ORAL | 1 refills | Status: DC | PRN

## 2020-06-03 ENCOUNTER — Telehealth: Payer: Self-pay

## 2020-06-03 ENCOUNTER — Encounter: Payer: Self-pay | Admitting: Internal Medicine

## 2020-06-03 NOTE — Telephone Encounter (Signed)
Key: [id number]

## 2020-06-06 NOTE — Telephone Encounter (Signed)
PA denied.

## 2020-06-18 LAB — COLOGUARD: Cologuard: NEGATIVE

## 2020-07-25 ENCOUNTER — Other Ambulatory Visit: Payer: Self-pay

## 2020-07-25 ENCOUNTER — Ambulatory Visit (INDEPENDENT_AMBULATORY_CARE_PROVIDER_SITE_OTHER): Payer: 59

## 2020-07-25 DIAGNOSIS — Z23 Encounter for immunization: Secondary | ICD-10-CM

## 2020-07-25 NOTE — Progress Notes (Signed)
Pt here for 2nd Shingrix injection per Dr. Ronnald Ramp  Shingrix left IM, and pt tolerated injection well.

## 2020-12-22 ENCOUNTER — Ambulatory Visit: Payer: 59 | Admitting: Internal Medicine

## 2021-01-06 ENCOUNTER — Other Ambulatory Visit: Payer: Self-pay

## 2021-01-06 ENCOUNTER — Ambulatory Visit: Payer: 59 | Admitting: Sports Medicine

## 2021-01-06 VITALS — BP 116/84 | HR 78 | Wt 210.0 lb

## 2021-01-06 DIAGNOSIS — M545 Low back pain, unspecified: Secondary | ICD-10-CM | POA: Diagnosis not present

## 2021-01-06 MED ORDER — MELOXICAM 15 MG PO TABS: 15.0000 mg | ORAL_TABLET | Freq: Every day | ORAL | 0 refills | Status: DC

## 2021-01-06 NOTE — Progress Notes (Signed)
    Benito Mccreedy D.[place] Phone: [phone]   Assessment and Plan:     1. 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  Patient is not suffering significantly from muscle spasms at this time so we did not prescribe medication.  If muscle spasms start, patient may call back and we can prescribe medication via telephone encounter   Pertinent previous records reviewed include none   Follow Up: As needed if no improvement or worsening of symptoms 3 to 4 weeks   Subjective:   I, Vilma Meckel, am serving as a Education administrator for Dr. Glennon Mac  Chief Complaint: LBP  HPI:   01/06/21 LBP started last weekend. Constant all day . Takes a while to stand up straight. Sharp pain more right side. On and off usually goes away. Around SI same spot.  Denies change in urinary frequency, dysuria, flank pain, hematuria, fever, chills, saddle paresthesia, urinary incontinence.  Relevant Historical Information: None pertinent  Additional pertinent review of systems negative.   Current Outpatient Medications:    meloxicam (MOBIC) 15 MG tablet, Take 1 tablet (15 mg total) by mouth daily., Disp: 30 tablet, Rfl: 0   Galcanezumab-gnlm (EMGALITY) 120 MG/ML SOAJ, Inject 1 Act into the skin every 30 (thirty) days., Disp: 3.36 mL, Rfl: 1   solifenacin (VESICARE) 5 MG tablet, Take 1 tablet (5 mg total) by mouth daily., Disp: 90 tablet, Rfl: 0   Ubrogepant (UBRELVY) 100 MG TABS, Take 1 tablet by mouth daily as needed., Disp: 30 tablet, Rfl: 1   Objective:     Vitals:   01/06/21 1119  BP: 116/84  Pulse: 78  SpO2: 97%   Weight: 210 lb (95.3 kg)      Body mass index is 30.13 kg/m.    Physical Exam:    Gen: Appears well, nad, nontoxic and pleasant Psych: Alert and oriented, appropriate mood and affect Neuro: sensation intact, strength is 5/5 in upper and lower extremities, muscle tone wnl Skin: no susupicious lesions or rashes  Back - Normal skin, Spine with normal alignment and no deformity.   No tenderness to vertebral process palpation.   Paraspinous muscles are not tender and without spasm Straight leg raise negative Trendelenberg positive on right Pain along right flank with resisted sidebending bilaterally Pain with right lumbar rotation  Electronically signed by:  Benito Mccreedy D.[facility] 11:49 AM 01/06/21

## 2021-01-06 NOTE — Patient Instructions (Addendum)
Meloxicam 15 mg daily for 2 weeks then as needed Do prescribed exercises at least 3x a week Follow up as needed if no improvement int 3 -4 weeks

## 2021-01-10 ENCOUNTER — Ambulatory Visit: Payer: 59 | Admitting: Internal Medicine

## 2021-02-02 ENCOUNTER — Other Ambulatory Visit: Payer: Self-pay | Admitting: Sports Medicine

## 2021-12-14 ENCOUNTER — Ambulatory Visit (INDEPENDENT_AMBULATORY_CARE_PROVIDER_SITE_OTHER): Payer: 59

## 2021-12-14 ENCOUNTER — Encounter: Payer: Self-pay | Admitting: Internal Medicine

## 2021-12-14 ENCOUNTER — Ambulatory Visit (INDEPENDENT_AMBULATORY_CARE_PROVIDER_SITE_OTHER): Payer: 59 | Admitting: Internal Medicine

## 2021-12-14 VITALS — BP 126/86 | HR 62 | Temp 98.1°F | Ht 70.0 in | Wt 221.0 lb

## 2021-12-14 DIAGNOSIS — G43719 Chronic migraine without aura, intractable, without status migrainosus: Secondary | ICD-10-CM

## 2021-12-14 DIAGNOSIS — Z0001 Encounter for general adult medical examination with abnormal findings: Secondary | ICD-10-CM | POA: Diagnosis not present

## 2021-12-14 DIAGNOSIS — R079 Chest pain, unspecified: Secondary | ICD-10-CM | POA: Diagnosis not present

## 2021-12-14 MED ORDER — NURTEC 75 MG PO TBDP: 1.0000 | ORAL_TABLET | ORAL | 1 refills | Status: DC

## 2021-12-14 NOTE — Patient Instructions (Signed)
Health Maintenance, Male Adopting a healthy lifestyle and getting preventive care are important in promoting health and wellness. Ask your health care provider about: The right schedule for you to have regular tests and exams. Things you can do on your own to prevent diseases and keep yourself healthy. What should I know about diet, weight, and exercise? Eat a healthy diet  Eat a diet that includes plenty of vegetables, fruits, low-fat dairy products, and lean protein. Do not eat a lot of foods that are high in solid fats, added sugars, or sodium. Maintain a healthy weight Body mass index (BMI) is a measurement that can be used to identify possible weight problems. It estimates body fat based on height and weight. Your health care provider can help determine your BMI and help you achieve or maintain a healthy weight. Get regular exercise Get regular exercise. This is one of the most important things you can do for your health. Most adults should: Exercise for at least 150 minutes each week. The exercise should increase your heart rate and make you sweat (moderate-intensity exercise). Do strengthening exercises at least twice a week. This is in addition to the moderate-intensity exercise. Spend less time sitting. Even light physical activity can be beneficial. Watch cholesterol and blood lipids Have your blood tested for lipids and cholesterol at 48 years of age, then have this test every 5 years. You may need to have your cholesterol levels checked more often if: Your lipid or cholesterol levels are high. You are older than 48 years of age. You are at high risk for heart disease. What should I know about cancer screening? Many types of cancers can be detected early and may often be prevented. Depending on your health history and family history, you may need to have cancer screening at various ages. This may include screening for: Colorectal cancer. Prostate cancer. Skin cancer. Lung  cancer. What should I know about heart disease, diabetes, and high blood pressure? Blood pressure and heart disease High blood pressure causes heart disease and increases the risk of stroke. This is more likely to develop in people who have high blood pressure readings or are overweight. Talk with your health care provider about your target blood pressure readings. Have your blood pressure checked: Every 3-5 years if you are 18-39 years of age. Every year if you are 40 years old or older. If you are between the ages of 65 and 75 and are a current or former smoker, ask your health care provider if you should have a one-time screening for abdominal aortic aneurysm (AAA). Diabetes Have regular diabetes screenings. This checks your fasting blood sugar level. Have the screening done: Once every three years after age 45 if you are at a normal weight and have a low risk for diabetes. More often and at a younger age if you are overweight or have a high risk for diabetes. What should I know about preventing infection? Hepatitis B If you have a higher risk for hepatitis B, you should be screened for this virus. Talk with your health care provider to find out if you are at risk for hepatitis B infection. Hepatitis C Blood testing is recommended for: Everyone born from 1945 through 1965. Anyone with known risk factors for hepatitis C. Sexually transmitted infections (STIs) You should be screened each year for STIs, including gonorrhea and chlamydia, if: You are sexually active and are younger than 48 years of age. You are older than 48 years of age and your   health care provider tells you that you are at risk for this type of infection. Your sexual activity has changed since you were last screened, and you are at increased risk for chlamydia or gonorrhea. Ask your health care provider if you are at risk. Ask your health care provider about whether you are at high risk for HIV. Your health care provider  may recommend a prescription medicine to help prevent HIV infection. If you choose to take medicine to prevent HIV, you should first get tested for HIV. You should then be tested every 3 months for as long as you are taking the medicine. Follow these instructions at home: Alcohol use Do not drink alcohol if your health care provider tells you not to drink. If you drink alcohol: Limit how much you have to 0-2 drinks a day. Know how much alcohol is in your drink. In the [place] one drink equals one 12 oz bottle of beer (355 mL), one 5 oz glass of wine (148 mL), or one 1 oz glass of hard liquor (44 mL). Lifestyle Do not use any products that contain nicotine or tobacco. These products include cigarettes, chewing tobacco, and vaping devices, such as e-cigarettes. If you need help quitting, ask your health care provider. Do not use street drugs. Do not share needles. Ask your health care provider for help if you need support or information about quitting drugs. General instructions Schedule regular health, dental, and eye exams. Stay current with your vaccines. Tell your health care provider if: You often feel depressed. You have ever been abused or do not feel safe at home. Summary Adopting a healthy lifestyle and getting preventive care are important in promoting health and wellness. Follow your health care provider's instructions about healthy diet, exercising, and getting tested or screened for diseases. Follow your health care provider's instructions on monitoring your cholesterol and blood pressure. This information is not intended to replace advice given to you by your health care provider. Make sure you discuss any questions you have with your health care provider. Document Revised: 07/18/2020 Document Reviewed: 07/18/2020 Elsevier Patient Education  2023 [employer]  

## 2021-12-14 NOTE — Progress Notes (Signed)
Subjective:  Patient ID: Cody Bright, male    DOB: [birth year]  Age: 46 y.o. MRN: [id number]  CC: Annual Exam and Headache   HPI Cody Bright presents for a CPX and f/up -   He has about 6 migraine headaches per month.  The headaches have not changed in their severity or description but are more frequent.  The headaches occur around the right frontal region and his right eye and cause photosensitivity.  The headaches can last for greater than a day.  He gets some symptom relief with Ubrelvy.  He also complains of a 29-month history of left anterior chest wall pain that occurs at rest and with movement.  He is active and denies exertional chest pain, dyspnea on exertion, cough, diaphoresis, or edema.  Outpatient Medications Prior to Visit  Medication Sig Dispense Refill   Ubrogepant (UBRELVY) 100 MG TABS Take 1 tablet by mouth daily as needed. 30 tablet 1   Galcanezumab-gnlm (EMGALITY) 120 MG/ML SOAJ Inject 1 Act into the skin every 30 (thirty) days. 3.36 mL 1   meloxicam (MOBIC) 15 MG tablet Take 1 tablet (15 mg total) by mouth daily. 30 tablet 0   solifenacin (VESICARE) 5 MG tablet Take 1 tablet (5 mg total) by mouth daily. 90 tablet 0   No facility-administered medications prior to visit.    ROS Review of Systems  Constitutional: Negative.  Negative for diaphoresis and fatigue.  HENT: Negative.    Eyes: Negative.   Respiratory:  Negative for cough, chest tightness, shortness of breath and wheezing.   Cardiovascular:  Positive for chest pain. Negative for palpitations and leg swelling.  Gastrointestinal:  Negative for abdominal pain, constipation, diarrhea, nausea and vomiting.  Genitourinary: Negative.  Negative for difficulty urinating.  Musculoskeletal: Negative.   Skin: Negative.   Neurological:  Positive for headaches. Negative for dizziness, weakness, light-headedness and numbness.  Hematological:  Negative for adenopathy. Does not bruise/bleed easily.   Psychiatric/Behavioral: Negative.      Objective:  BP 126/86 (BP Location: Left Arm, Patient Position: Sitting, Cuff Size: Large)   Pulse 62   Temp 98.1 F (36.7 C) (Oral)   Ht 5\' 10"  (1.778 m)   Wt 221 lb (100.2 kg)   SpO2 98%   BMI 31.71 kg/m   BP Readings from Last 3 Encounters:  12/14/21 126/86  01/06/21 116/84  05/31/20 118/80    Wt Readings from Last 3 Encounters:  12/14/21 221 lb (100.2 kg)  01/06/21 210 lb (95.3 kg)  05/31/20 223 lb (101.2 kg)    Physical Exam Vitals reviewed.  HENT:     Nose: Nose normal.     Mouth/Throat:     Mouth: Mucous membranes are moist.  Eyes:     General: No scleral icterus.    Conjunctiva/sclera: Conjunctivae normal.  Cardiovascular:     Rate and Rhythm: Regular rhythm. Bradycardia present.     Heart sounds: Normal heart sounds, S1 normal and S2 normal.     No friction rub. No gallop.     Comments: EKG- SB, 57 bpm No ST/T wave changes or Q waves Pulmonary:     Breath sounds: No stridor. No wheezing, rhonchi or rales.  Chest:     Chest wall: No mass, deformity, swelling, tenderness or crepitus.  Breasts:    Right: Normal.     Left: Normal.  Abdominal:     General: Abdomen is flat.     Palpations: There is no mass.     Tenderness: There  is no abdominal tenderness. There is no guarding.     Hernia: No hernia is present.  Musculoskeletal:        General: Normal range of motion.     Cervical back: Neck supple. No tenderness.     Right lower leg: No edema.     Left lower leg: No edema.  Lymphadenopathy:     Upper Body:     Right upper body: No supraclavicular, axillary or pectoral adenopathy.     Left upper body: No supraclavicular, axillary or pectoral adenopathy.  Skin:    General: Skin is warm and dry.  Neurological:     General: No focal deficit present.     Mental Status: He is alert. Mental status is at baseline.     Lab Results  Component Value Date   WBC 8.3 05/31/2020   HGB 16.3 05/31/2020   HCT 46.9  05/31/2020   PLT 281.0 05/31/2020   GLUCOSE 91 05/31/2020   CHOL 168 05/31/2020   TRIG 136.0 05/31/2020   HDL 33.70 (L) 05/31/2020   LDLCALC 108 (H) 05/31/2020   ALT 25 05/31/2020   AST 11 05/31/2020   NA 139 05/31/2020   K 4.3 05/31/2020   CL 103 05/31/2020   CREATININE 1.31 05/31/2020   BUN 17 05/31/2020   CO2 27 05/31/2020   TSH 1.97 05/31/2020   PSA 0.83 05/31/2020    DG Finger Ring Right  Result Date: 12/14/2015 CLINICAL DATA:  Finger caught hand automatic window of car EXAM: RIGHT FOURTH FINGER 2+V COMPARISON:  None. FINDINGS: Frontal, oblique, and lateral views obtained. There is an incomplete nondisplaced fracture of the distal aspect of the fourth distal phalanx. There is soft tissue swelling distally. No other fracture. No dislocation. No evident arthropathy. IMPRESSION: Incomplete nondisplaced fracture distal aspect fourth distal phalanx. Soft tissue swelling distally. No other fracture. No dislocation. No evident arthropathy. These results will be called to the ordering clinician or representative by the Radiologist Assistant, and communication documented in the PACS or zVision Dashboard. Electronically Signed   By: Lowella Grip III M.D.   On: 12/14/2015 08:47    DG Chest 2 View  Result Date: 12/14/2021 CLINICAL DATA:  Left-sided chest pain for 3 months EXAM: CHEST - 2 VIEW COMPARISON:  None Available. FINDINGS: The heart size and mediastinal contours are within normal limits. Both lungs are clear. The visualized skeletal structures are unremarkable. IMPRESSION: No active cardiopulmonary disease. Electronically Signed   By: Dorise Bullion III M.D.   On: 12/14/2021 17:48     Assessment & Plan:   Arad was seen today for annual exam and headache.  Diagnoses and all orders for this visit:  Chest pain at rest - Based on his symptoms, exam, EKG, and CXR this is c/w MSK CW pain. Reassurance offered.  -     EKG 12-Lead -     DG Chest 2 View; Future  Intractable  chronic migraine without aura and without status migrainosus- Will start a CGRP agonist for treatment and prevention. -     Rimegepant Sulfate (NURTEC) 75 MG TBDP; Take 1 tablet by mouth every other day.  Encounter for general adult medical examination with abnormal findings- Exam completed, no labs indicated, cancer screenings are UTD. Pt ed material was given.   I have discontinued Greg Cutter. Coster "Jason"'s solifenacin, Emgality, and meloxicam. I am also having him start on Nurtec. Additionally, I am having him maintain his Roselyn Meier.  Meds ordered this encounter  Medications   Rimegepant  Sulfate (NURTEC) 75 MG TBDP    Sig: Take 1 tablet by mouth every other day.    Dispense:  46 tablet    Refill:  1     Follow-up: Return in about 6 months (around 06/15/2022).  Sanda Linger, MD

## 2023-01-30 ENCOUNTER — Encounter: Payer: Self-pay | Admitting: Internal Medicine

## 2023-01-30 ENCOUNTER — Ambulatory Visit (INDEPENDENT_AMBULATORY_CARE_PROVIDER_SITE_OTHER): Payer: 59 | Admitting: Internal Medicine

## 2023-01-30 VITALS — BP 128/82 | HR 60 | Temp 97.8°F | Resp 16 | Ht 70.0 in | Wt 196.0 lb

## 2023-01-30 DIAGNOSIS — Z0000 Encounter for general adult medical examination without abnormal findings: Secondary | ICD-10-CM

## 2023-01-30 DIAGNOSIS — Z0001 Encounter for general adult medical examination with abnormal findings: Secondary | ICD-10-CM

## 2023-01-30 NOTE — Progress Notes (Unsigned)
Subjective:  Patient ID: Cody Bright, male    DOB: 1975/07/25  Age: 47 y.o. MRN: [id number]  CC: Annual Exam   HPI Cody Bright presents for a CPX.  Discussed the use of AI scribe software for clinical note transcription with the patient, who gave verbal consent to proceed.  History of Present Illness   The patient, nearing 47 years of age, reports a significant weight loss of 25 pounds since March, attributed to increased physical activity including pickleball and gym workouts. He denies any chest pain, shortness of breath, or dizziness. The patient also reports a decrease in headache frequency, possibly related to increased water intake. He is not currently taking any medications for pain or any other conditions.  He has chosen to delay prostate cancer screening until reaching the age of 52.       Outpatient Medications Prior to Visit  Medication Sig Dispense Refill  . Rimegepant Sulfate (NURTEC) 75 MG TBDP Take 1 tablet by mouth every other day. 46 tablet 1  . Ubrogepant (UBRELVY) 100 MG TABS Take 1 tablet by mouth daily as needed. 30 tablet 1   No facility-administered medications prior to visit.    ROS Review of Systems  Constitutional: Negative.  Negative for appetite change, diaphoresis and fatigue.  HENT: Negative.    Eyes: Negative.   Respiratory:  Negative for cough, chest tightness, shortness of breath and wheezing.   Cardiovascular:  Negative for chest pain, palpitations and leg swelling.  Gastrointestinal:  Negative for abdominal pain, diarrhea, nausea and vomiting.  Endocrine: Negative.   Genitourinary: Negative.  Negative for difficulty urinating, penile swelling and scrotal swelling.  Musculoskeletal: Negative.  Negative for arthralgias and myalgias.  Skin: Negative.   Neurological: Negative.   Hematological:  Negative for adenopathy. Does not bruise/bleed easily.  Psychiatric/Behavioral: Negative.      Objective:  BP 128/82 (BP Location:  Left Arm, Patient Position: Sitting, Cuff Size: Normal)   Pulse 60   Temp 97.8 F (36.6 C) (Oral)   Resp 16   Ht 5\' 10"  (1.778 m)   Wt 196 lb (88.9 kg)   SpO2 97%   BMI 28.12 kg/m   BP Readings from Last 3 Encounters:  01/30/23 128/82  12/14/21 126/86  01/06/21 116/84    Wt Readings from Last 3 Encounters:  01/30/23 196 lb (88.9 kg)  12/14/21 221 lb (100.2 kg)  01/06/21 210 lb (95.3 kg)    Physical Exam Vitals reviewed.  Constitutional:      Appearance: Normal appearance.  HENT:     Mouth/Throat:     Mouth: Mucous membranes are moist.  Eyes:     General: No scleral icterus.    Conjunctiva/sclera: Conjunctivae normal.  Cardiovascular:     Rate and Rhythm: Normal rate and regular rhythm.     Heart sounds: No murmur heard. Pulmonary:     Effort: Pulmonary effort is normal.     Breath sounds: No stridor. No wheezing, rhonchi or rales.  Abdominal:     General: Abdomen is flat.     Palpations: There is no mass.     Tenderness: There is no abdominal tenderness. There is no guarding.     Hernia: No hernia is present.  Musculoskeletal:        General: Normal range of motion.     Cervical back: Neck supple.     Right lower leg: No edema.     Left lower leg: No edema.  Lymphadenopathy:     Cervical:  No cervical adenopathy.  Skin:    General: Skin is warm and dry.  Neurological:     General: No focal deficit present.     Mental Status: He is alert. Mental status is at baseline.  Psychiatric:        Mood and Affect: Mood normal.        Behavior: Behavior normal.    Lab Results  Component Value Date   WBC 8.3 05/31/2020   HGB 16.3 05/31/2020   HCT 46.9 05/31/2020   PLT 281.0 05/31/2020   GLUCOSE 91 05/31/2020   CHOL 168 05/31/2020   TRIG 136.0 05/31/2020   HDL 33.70 (L) 05/31/2020   LDLCALC 108 (H) 05/31/2020   ALT 25 05/31/2020   AST 11 05/31/2020   NA 139 05/31/2020   K 4.3 05/31/2020   CL 103 05/31/2020   CREATININE 1.31 05/31/2020   BUN 17  05/31/2020   CO2 27 05/31/2020   TSH 1.97 05/31/2020   PSA 0.83 05/31/2020    DG Finger Ring Right  Result Date: 12/14/2015 CLINICAL DATA:  Finger caught hand automatic window of car EXAM: RIGHT FOURTH FINGER 2+V COMPARISON:  None. FINDINGS: Frontal, oblique, and lateral views obtained. There is an incomplete nondisplaced fracture of the distal aspect of the fourth distal phalanx. There is soft tissue swelling distally. No other fracture. No dislocation. No evident arthropathy. IMPRESSION: Incomplete nondisplaced fracture distal aspect fourth distal phalanx. Soft tissue swelling distally. No other fracture. No dislocation. No evident arthropathy. These results will be called to the ordering clinician or representative by the Radiologist Assistant, and communication documented in the PACS or zVision Dashboard. Electronically Signed   By: Bretta Bang III M.D.   On: 12/14/2015 08:47    Assessment & Plan:   Encounter for general adult medical examination with abnormal findings-     Follow-up: Return in about 1 year (around 01/30/2024).  Sanda Linger, MD

## 2023-01-30 NOTE — Patient Instructions (Signed)
Health Maintenance, Male Adopting a healthy lifestyle and getting preventive care are important in promoting health and wellness. Ask your health care provider about: The right schedule for you to have regular tests and exams. Things you can do on your own to prevent diseases and keep yourself healthy. What should I know about diet, weight, and exercise? Eat a healthy diet  Eat a diet that includes plenty of vegetables, fruits, low-fat dairy products, and lean protein. Do not eat a lot of foods that are high in solid fats, added sugars, or sodium. Maintain a healthy weight Body mass index (BMI) is a measurement that can be used to identify possible weight problems. It estimates body fat based on height and weight. Your health care provider can help determine your BMI and help you achieve or maintain a healthy weight. Get regular exercise Get regular exercise. This is one of the most important things you can do for your health. Most adults should: Exercise for at least 150 minutes each week. The exercise should increase your heart rate and make you sweat (moderate-intensity exercise). Do strengthening exercises at least twice a week. This is in addition to the moderate-intensity exercise. Spend less time sitting. Even light physical activity can be beneficial. Watch cholesterol and blood lipids Have your blood tested for lipids and cholesterol at 49 years of age, then have this test every 5 years. You may need to have your cholesterol levels checked more often if: Your lipid or cholesterol levels are high. You are older than 49 years of age. You are at high risk for heart disease. What should I know about cancer screening? Many types of cancers can be detected early and may often be prevented. Depending on your health history and family history, you may need to have cancer screening at various ages. This may include screening for: Colorectal cancer. Prostate cancer. Skin cancer. Lung  cancer. What should I know about heart disease, diabetes, and high blood pressure? Blood pressure and heart disease High blood pressure causes heart disease and increases the risk of stroke. This is more likely to develop in people who have high blood pressure readings or are overweight. Talk with your health care provider about your target blood pressure readings. Have your blood pressure checked: Every 3-5 years if you are 18-39 years of age. Every year if you are 40 years old or older. If you are between the ages of 65 and 75 and are a current or former smoker, ask your health care provider if you should have a one-time screening for abdominal aortic aneurysm (AAA). Diabetes Have regular diabetes screenings. This checks your fasting blood sugar level. Have the screening done: Once every three years after age 45 if you are at a normal weight and have a low risk for diabetes. More often and at a younger age if you are overweight or have a high risk for diabetes. What should I know about preventing infection? Hepatitis B If you have a higher risk for hepatitis B, you should be screened for this virus. Talk with your health care provider to find out if you are at risk for hepatitis B infection. Hepatitis C Blood testing is recommended for: Everyone born from 1945 through 1965. Anyone with known risk factors for hepatitis C. Sexually transmitted infections (STIs) You should be screened each year for STIs, including gonorrhea and chlamydia, if: You are sexually active and are younger than 49 years of age. You are older than 49 years of age and your   health care provider tells you that you are at risk for this type of infection. Your sexual activity has changed since you were last screened, and you are at increased risk for chlamydia or gonorrhea. Ask your health care provider if you are at risk. Ask your health care provider about whether you are at high risk for HIV. Your health care provider  may recommend a prescription medicine to help prevent HIV infection. If you choose to take medicine to prevent HIV, you should first get tested for HIV. You should then be tested every 3 months for as long as you are taking the medicine. Follow these instructions at home: Alcohol use Do not drink alcohol if your health care provider tells you not to drink. If you drink alcohol: Limit how much you have to 0-2 drinks a day. Know how much alcohol is in your drink. In the [place] one drink equals one 12 oz bottle of beer (355 mL), one 5 oz glass of wine (148 mL), or one 1 oz glass of hard liquor (44 mL). Lifestyle Do not use any products that contain nicotine or tobacco. These products include cigarettes, chewing tobacco, and vaping devices, such as e-cigarettes. If you need help quitting, ask your health care provider. Do not use street drugs. Do not share needles. Ask your health care provider for help if you need support or information about quitting drugs. General instructions Schedule regular health, dental, and eye exams. Stay current with your vaccines. Tell your health care provider if: You often feel depressed. You have ever been abused or do not feel safe at home. Summary Adopting a healthy lifestyle and getting preventive care are important in promoting health and wellness. Follow your health care provider's instructions about healthy diet, exercising, and getting tested or screened for diseases. Follow your health care provider's instructions on monitoring your cholesterol and blood pressure. This information is not intended to replace advice given to you by your health care provider. Make sure you discuss any questions you have with your health care provider. Document Revised: 07/18/2020 Document Reviewed: 07/18/2020 Elsevier Patient Education  2024 [employer] Inc[employer]  

## 2023-10-16 LAB — COLOGUARD: Cologuard: NEGATIVE

## 2024-04-02 ENCOUNTER — Encounter: Payer: Self-pay | Admitting: Internal Medicine

## 2024-04-02 ENCOUNTER — Ambulatory Visit: Admitting: Internal Medicine

## 2024-04-02 VITALS — BP 122/82 | HR 67 | Temp 98.0°F | Resp 16 | Ht 70.0 in | Wt 217.6 lb

## 2024-04-02 DIAGNOSIS — H9319 Tinnitus, unspecified ear: Secondary | ICD-10-CM | POA: Insufficient documentation

## 2024-04-02 DIAGNOSIS — Z0001 Encounter for general adult medical examination with abnormal findings: Secondary | ICD-10-CM

## 2024-04-02 DIAGNOSIS — L821 Other seborrheic keratosis: Secondary | ICD-10-CM | POA: Insufficient documentation

## 2024-04-02 DIAGNOSIS — L918 Other hypertrophic disorders of the skin: Secondary | ICD-10-CM | POA: Insufficient documentation

## 2024-04-02 DIAGNOSIS — H9313 Tinnitus, bilateral: Secondary | ICD-10-CM

## 2024-04-02 DIAGNOSIS — H524 Presbyopia: Secondary | ICD-10-CM | POA: Insufficient documentation

## 2024-04-02 DIAGNOSIS — Z0000 Encounter for general adult medical examination without abnormal findings: Secondary | ICD-10-CM

## 2024-04-02 DIAGNOSIS — G43909 Migraine, unspecified, not intractable, without status migrainosus: Secondary | ICD-10-CM | POA: Insufficient documentation

## 2024-04-02 NOTE — Progress Notes (Addendum)
 "       Subjective:  Patient ID: Cody Bright, male    DOB: January 22, 1976  Age: 49 y.o. MRN: [id number]  CC: Annual Exam and Referral (Dermatologist for moles and skin tags. /Tenonitis specialist.  )   HPI Cody Bright presents for a CPX-  Discussed the use of AI scribe software for clinical note transcription with the patient, who gave verbal consent to proceed.  History of Present Illness Cody Bright is a 49 year old male who presents with tinnitus and skin lesions.  He experiences tinnitus, describing it as a sound similar to 'water flowing through a pipe.' It is more noticeable in quiet environments, particularly affecting his sleep. He is unsure if the tinnitus is related to his hearing loss. He acknowledges hearing loss but denies dizziness or lightheadedness.  He has skin tags and a newly developed pigmented lesion on his left shoulder. The skin tags are located in uncomfortable areas and have recurred after previous removal attempts. The pigmented lesion on his left shoulder has appeared recently and is the only one of its kind that he is aware of. No family history of skin cancer.  He underwent a Cologuard test for colon cancer screening at the [facility] in mid to late 2025. He receives some of his medical care at the [facility] but notes that they do not address cosmetic issues such as skin tag removal.  He remains active by playing pickleball three to four days a week. He reports occasional headaches, which he attributes to sleeping posture, and denies any nausea or vomiting. He experiences diarrhea more frequently than he believes is normal, which he associates with dehydration from physical activity, despite drinking water regularly. No chest pain, shortness of breath, constipation, or blood in the stool.  He does labs, etc at the [facility].   Outpatient Medications Prior to Visit  Medication Sig Dispense Refill   SUMAtriptan (IMITREX) 50 MG tablet Take 50 mg by  mouth as needed for migraine or headache.     No facility-administered medications prior to visit.    ROS Review of Systems  Constitutional:  Negative for diaphoresis and fatigue.  HENT:  Positive for hearing loss and tinnitus. Negative for ear discharge, ear pain and sinus pressure.   Eyes: Negative.   Respiratory: Negative.  Negative for cough and shortness of breath.   Cardiovascular:  Negative for chest pain, palpitations and leg swelling.  Gastrointestinal:  Negative for abdominal pain, constipation, diarrhea, nausea and vomiting.  Endocrine: Negative.   Genitourinary: Negative.  Negative for difficulty urinating, penile swelling, scrotal swelling and testicular pain.  Musculoskeletal: Negative.   Skin:  Positive for color change.  Neurological: Negative.  Negative for dizziness and light-headedness.  Hematological:  Negative for adenopathy. Does not bruise/bleed easily.  Psychiatric/Behavioral: Negative.      Objective:  BP 122/82 (BP Location: Left Arm, Patient Position: Sitting, Cuff Size: Normal)   Pulse 67   Temp 98 F (36.7 C) (Oral)   Resp 16   Ht 5' 10 (1.778 m)   Wt 217 lb 9.6 oz (98.7 kg)   SpO2 96%   BMI 31.22 kg/m   BP Readings from Last 3 Encounters:  04/02/24 122/82  01/30/23 128/82  12/14/21 126/86    Wt Readings from Last 3 Encounters:  04/02/24 217 lb 9.6 oz (98.7 kg)  01/30/23 196 lb (88.9 kg)  12/14/21 221 lb (100.2 kg)    Physical Exam Vitals reviewed.  Constitutional:  Appearance: Normal appearance.  HENT:     Mouth/Throat:     Mouth: Mucous membranes are moist.  Eyes:     General: No scleral icterus.    Conjunctiva/sclera: Conjunctivae normal.  Cardiovascular:     Rate and Rhythm: Normal rate and regular rhythm.     Heart sounds: No murmur heard.    No friction rub. No gallop.  Pulmonary:     Effort: Pulmonary effort is normal.     Breath sounds: No stridor. No wheezing, rhonchi or rales.  Abdominal:     General: Abdomen  is flat.     Palpations: There is no mass.     Tenderness: There is no abdominal tenderness. There is no guarding.     Hernia: No hernia is present.  Genitourinary:    Comments: GU/rectal deferred at his request Musculoskeletal:        General: Normal range of motion.     Cervical back: Neck supple.     Right lower leg: No edema.     Left lower leg: No edema.  Lymphadenopathy:     Cervical: No cervical adenopathy.  Skin:    General: Skin is warm and dry.     Findings: Lesion present.  Neurological:     General: No focal deficit present.     Mental Status: He is alert.  Psychiatric:        Mood and Affect: Mood normal.        Behavior: Behavior normal.     Lab Results  Component Value Date   WBC 8.3 05/31/2020   HGB 16.3 05/31/2020   HCT 46.9 05/31/2020   PLT 281.0 05/31/2020   GLUCOSE 91 05/31/2020   CHOL 168 05/31/2020   TRIG 136.0 05/31/2020   HDL 33.70 (L) 05/31/2020   LDLCALC 108 (H) 05/31/2020   ALT 25 05/31/2020   AST 11 05/31/2020   NA 139 05/31/2020   K 4.3 05/31/2020   CL 103 05/31/2020   CREATININE 1.31 05/31/2020   BUN 17 05/31/2020   CO2 27 05/31/2020   TSH 1.97 05/31/2020   PSA 0.83 05/31/2020    DG Finger Ring Right Result Date: 12/14/2015 CLINICAL DATA:  Finger caught hand automatic window of car EXAM: RIGHT FOURTH FINGER 2+V COMPARISON:  None. FINDINGS: Frontal, oblique, and lateral views obtained. There is an incomplete nondisplaced fracture of the distal aspect of the fourth distal phalanx. There is soft tissue swelling distally. No other fracture. No dislocation. No evident arthropathy. IMPRESSION: Incomplete nondisplaced fracture distal aspect fourth distal phalanx. Soft tissue swelling distally. No other fracture. No dislocation. No evident arthropathy. These results will be called to the ordering clinician or representative by the Radiologist Assistant, and communication documented in the PACS or zVision Dashboard. Electronically Signed   By:  Elsie Repine III M.D.   On: 12/14/2015 08:47    Assessment & Plan:   Tinnitus of both ears -     Ambulatory referral to Audiology  Multiple skin tags -     Ambulatory referral to Plastic Surgery  Encounter for general adult medical examination with abnormal findings- Exam completed, labs requested from the [facility], vaccines reviewed (he refused), cancer screenings are UTD, pt ed material was given.      Follow-up: Return in about 1 year (around 04/02/2025).  Debby Molt, MD "

## 2024-04-02 NOTE — Patient Instructions (Signed)
 Health Maintenance, Male  Adopting a healthy lifestyle and getting preventive care are important in promoting health and wellness. Ask your health care provider about:  The right schedule for you to have regular tests and exams.  Things you can do on your own to prevent diseases and keep yourself healthy.  What should I know about diet, weight, and exercise?  Eat a healthy diet    Eat a diet that includes plenty of vegetables, fruits, low-fat dairy products, and lean protein.  Do not eat a lot of foods that are high in solid fats, added sugars, or sodium.  Maintain a healthy weight  Body mass index (BMI) is a measurement that can be used to identify possible weight problems. It estimates body fat based on height and weight. Your health care provider can help determine your BMI and help you achieve or maintain a healthy weight.  Get regular exercise  Get regular exercise. This is one of the most important things you can do for your health. Most adults should:  Exercise for at least 150 minutes each week. The exercise should increase your heart rate and make you sweat (moderate-intensity exercise).  Do strengthening exercises at least twice a week. This is in addition to the moderate-intensity exercise.  Spend less time sitting. Even light physical activity can be beneficial.  Watch cholesterol and blood lipids  Have your blood tested for lipids and cholesterol at 51 years of age, then have this test every 5 years.  You may need to have your cholesterol levels checked more often if:  Your lipid or cholesterol levels are high.  You are older than 51 years of age.  You are at high risk for heart disease.  What should I know about cancer screening?  Many types of cancers can be detected early and may often be prevented. Depending on your health history and family history, you may need to have cancer screening at various ages. This may include screening for:  Colorectal cancer.  Prostate cancer.  Skin cancer.  Lung  cancer.  What should I know about heart disease, diabetes, and high blood pressure?  Blood pressure and heart disease  High blood pressure causes heart disease and increases the risk of stroke. This is more likely to develop in people who have high blood pressure readings or are overweight.  Talk with your health care provider about your target blood pressure readings.  Have your blood pressure checked:  Every 3-5 years if you are 24-52 years of age.  Every year if you are 3 years old or older.  If you are between the ages of 60 and 72 and are a current or former smoker, ask your health care provider if you should have a one-time screening for abdominal aortic aneurysm (AAA).  Diabetes  Have regular diabetes screenings. This checks your fasting blood sugar level. Have the screening done:  Once every three years after age 66 if you are at a normal weight and have a low risk for diabetes.  More often and at a younger age if you are overweight or have a high risk for diabetes.  What should I know about preventing infection?  Hepatitis B  If you have a higher risk for hepatitis B, you should be screened for this virus. Talk with your health care provider to find out if you are at risk for hepatitis B infection.  Hepatitis C  Blood testing is recommended for:  Everyone born from [birth year] through 1967.  Anyone  with known risk factors for hepatitis C.  Sexually transmitted infections (STIs)  You should be screened each year for STIs, including gonorrhea and chlamydia, if:  You are sexually active and are younger than 51 years of age.  You are older than 51 years of age and your health care provider tells you that you are at risk for this type of infection.  Your sexual activity has changed since you were last screened, and you are at increased risk for chlamydia or gonorrhea. Ask your health care provider if you are at risk.  Ask your health care provider about whether you are at high risk for HIV. Your health care provider  may recommend a prescription medicine to help prevent HIV infection. If you choose to take medicine to prevent HIV, you should first get tested for HIV. You should then be tested every 3 months for as long as you are taking the medicine.  Follow these instructions at home:  Alcohol use  Do not drink alcohol if your health care provider tells you not to drink.  If you drink alcohol:  Limit how much you have to 0-2 drinks a day.  Know how much alcohol is in your drink. In the [place] one drink equals one 12 oz bottle of beer (355 mL), one 5 oz glass of wine (148 mL), or one 1 oz glass of hard liquor (44 mL).  Lifestyle  Do not use any products that contain nicotine or tobacco. These products include cigarettes, chewing tobacco, and vaping devices, such as e-cigarettes. If you need help quitting, ask your health care provider.  Do not use street drugs.  Do not share needles.  Ask your health care provider for help if you need support or information about quitting drugs.  General instructions  Schedule regular health, dental, and eye exams.  Stay current with your vaccines.  Tell your health care provider if:  You often feel depressed.  You have ever been abused or do not feel safe at home.  Summary  Adopting a healthy lifestyle and getting preventive care are important in promoting health and wellness.  Follow your health care provider's instructions about healthy diet, exercising, and getting tested or screened for diseases.  Follow your health care provider's instructions on monitoring your cholesterol and blood pressure.  This information is not intended to replace advice given to you by your health care provider. Make sure you discuss any questions you have with your health care provider.  Document Revised: 07/18/2020 Document Reviewed: 07/18/2020  [employer]vier Patient Education  2024 [employer].

## 2024-05-08 ENCOUNTER — Institutional Professional Consult (permissible substitution)
